# Patient Record
Sex: Female | Born: 2005
Health system: Southern US, Community
[De-identification: ages and names within clinical notes are randomized; demographics above are authoritative.]

## PROBLEM LIST (undated history)

## (undated) HISTORY — PX: HIP SURGERY: SHX245

---
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
## 2023-03-11 ENCOUNTER — Encounter: Payer: Self-pay | Admitting: Obstetrics and Gynecology

## 2023-03-11 ENCOUNTER — Ambulatory Visit: Payer: BC Managed Care – PPO | Admitting: Obstetrics and Gynecology

## 2023-03-11 VITALS — BP 126/82 | HR 79 | Ht 65.0 in | Wt 145.0 lb

## 2023-03-11 DIAGNOSIS — N926 Irregular menstruation, unspecified: Secondary | ICD-10-CM | POA: Diagnosis not present

## 2023-03-11 DIAGNOSIS — M791 Myalgia, unspecified site: Secondary | ICD-10-CM | POA: Diagnosis not present

## 2023-03-11 DIAGNOSIS — N898 Other specified noninflammatory disorders of vagina: Secondary | ICD-10-CM | POA: Diagnosis not present

## 2023-03-11 DIAGNOSIS — B3732 Chronic candidiasis of vulva and vagina: Secondary | ICD-10-CM | POA: Diagnosis not present

## 2023-03-11 DIAGNOSIS — A6920 Lyme disease, unspecified: Secondary | ICD-10-CM | POA: Diagnosis not present

## 2023-03-11 DIAGNOSIS — R5383 Other fatigue: Secondary | ICD-10-CM | POA: Diagnosis not present

## 2023-03-11 DIAGNOSIS — B379 Candidiasis, unspecified: Secondary | ICD-10-CM | POA: Diagnosis not present

## 2023-03-11 DIAGNOSIS — F419 Anxiety disorder, unspecified: Secondary | ICD-10-CM | POA: Diagnosis not present

## 2023-03-11 LAB — WET PREP FOR TRICH, YEAST, CLUE

## 2023-03-11 MED ORDER — FLUCONAZOLE 150 MG PO TABS: 150.0000 mg | ORAL_TABLET | Freq: Once | ORAL | 0 refills | Status: AC

## 2023-03-11 NOTE — Progress Notes (Signed)
 GYNECOLOGY  VISIT   HPI: 18 y.o.   Single  Caucasian female   G0P0000 with Patient's last menstrual period was 03/10/2023.   here for: possible yeast inf- has had reoccurring yeast infections. Started using Accutane and noticed discharge off and on. Has previously used doxycycline off and on.  Accutane causes dryness.  Stopped in August.   Discharge and itching off and on.   Tried Monistat which provided some relief.   Started Doxycyline due to chronic lyme disease symptoms.  Joint, muscle and back pain.   Not SA ever.   In nursing school.  GYNECOLOGIC HISTORY: Patient's last menstrual period was 03/10/2023. Contraception:  abstinence Menopausal hormone therapy:  n/a Last 2 paps:  n/a History of abnormal Pap or positive HPV:  no Mammogram:  n/a        OB History     Gravida  0   Para  0   Term  0   Preterm  0   AB  0   Living  0      SAB  0   IAB  0   Ectopic  0   Multiple  0   Live Births  0              There are no active problems to display for this patient.   History reviewed. No pertinent past medical history.  Past Surgical History:  Procedure Laterality Date   HIP SURGERY  05/2019,02/2020   rt and left hip surgery    Current Outpatient Medications  Medication Sig Dispense Refill   Adapalene-Benzoyl Peroxide 0.3-2.5 % GEL as needed.     cetirizine (ZYRTEC) 10 MG tablet Take 10 mg by mouth daily.     doxycycline (MONODOX) 100 MG capsule Take 1 capsule by mouth twice daily x 10 days     Probiotic Product (PROBIOTIC PO) Take by mouth.     No current facility-administered medications for this visit.     ALLERGIES: Patient has no known allergies.  Family History  Problem Relation Age of Onset   Hypercholesterolemia Mother    Hypertension Father     Social History   Socioeconomic History   Marital status: Single    Spouse name: Not on file   Number of children: Not on file   Years of education: Not on file   Highest  education level: Not on file  Occupational History   Not on file  Tobacco Use   Smoking status: Never   Smokeless tobacco: Never  Substance and Sexual Activity   Alcohol use: Not Currently   Drug use: Not Currently   Sexual activity: Never    Comment: virgin  Other Topics Concern   Not on file  Social History Narrative   Not on file   Social Drivers of Health   Financial Resource Strain: Not on file  Food Insecurity: Not on file  Transportation Needs: Not on file  Physical Activity: Not on file  Stress: Not on file  Social Connections: Not on file  Intimate Partner Violence: Not on file    Review of Systems  Genitourinary:  Positive for vaginal discharge.  All other systems reviewed and are negative.   PHYSICAL EXAMINATION:   BP 126/82 (BP Location: Right Arm, Patient Position: Sitting, Cuff Size: Small)   Pulse 79   Ht 5' 5 (1.651 m)   Wt 145 lb (65.8 kg)   LMP 03/10/2023   SpO2 100%   BMI 24.13 kg/m  General appearance: alert, cooperative and appears stated age  Pelvic: External genitalia:  no lesions              Urethra:  normal appearing urethra with no masses, tenderness or lesions              Bartholins and Skenes: normal                 Vagina: deferred.  Q tip passed inside the vaginal canal.                 Bimanual Exam:  deferred.  ASSESSMENT:  Vaginal discharge.  PLAN:  Wet prep now:  positive for yeast, negative clue cells, negative trichomonas.  Diflucan  150 mg po x 1.  May repeat in 72 hours prn.  #2, RF none.  We reviewed risk factors for yeast infection:  form fitting clothing, wet gym clothes, taking antibiotics, prediabetes/diabetes/consuming increased sugar or carbohydrate.   20 min  total time was spent for this patient encounter, including preparation, face-to-face counseling with the patient, coordination of care, and documentation of the encounter.

## 2023-03-11 NOTE — Patient Instructions (Signed)

## 2023-07-08 DIAGNOSIS — L249 Irritant contact dermatitis, unspecified cause: Secondary | ICD-10-CM | POA: Diagnosis not present

## 2023-07-08 DIAGNOSIS — L858 Other specified epidermal thickening: Secondary | ICD-10-CM | POA: Diagnosis not present

## 2023-08-11 ENCOUNTER — Telehealth: Payer: Self-pay | Admitting: *Deleted

## 2023-08-11 DIAGNOSIS — R5383 Other fatigue: Secondary | ICD-10-CM | POA: Diagnosis not present

## 2023-08-11 DIAGNOSIS — F419 Anxiety disorder, unspecified: Secondary | ICD-10-CM | POA: Diagnosis not present

## 2023-08-11 DIAGNOSIS — N926 Irregular menstruation, unspecified: Secondary | ICD-10-CM | POA: Diagnosis not present

## 2023-08-11 DIAGNOSIS — E559 Vitamin D deficiency, unspecified: Secondary | ICD-10-CM | POA: Diagnosis not present

## 2023-08-11 DIAGNOSIS — Z77120 Contact with and (suspected) exposure to mold (toxic): Secondary | ICD-10-CM | POA: Diagnosis not present

## 2023-08-11 DIAGNOSIS — D899 Disorder involving the immune mechanism, unspecified: Secondary | ICD-10-CM | POA: Diagnosis not present

## 2023-08-11 DIAGNOSIS — M791 Myalgia, unspecified site: Secondary | ICD-10-CM | POA: Diagnosis not present

## 2023-08-11 NOTE — Telephone Encounter (Signed)
 TC from pt, DOB verified.  Pt was inquiring if we did TB skin  test.  She needs it for nursing school.  I advised we do not perform those here.  I advised to call PCP, an urgent care or go to HD.  Pt understood. Terri Fester CMA

## 2023-08-18 ENCOUNTER — Ambulatory Visit: Payer: Self-pay
